# Patient Record
Sex: Male | Born: 1975 | Race: White | Hispanic: No | Marital: Married | Smoking: Never smoker
Health system: Southern US, Community
[De-identification: ages and names within clinical notes are randomized; demographics above are authoritative.]

## PROBLEM LIST (undated history)

## (undated) DIAGNOSIS — N200 Calculus of kidney: Secondary | ICD-10-CM

## (undated) DIAGNOSIS — I10 Essential (primary) hypertension: Secondary | ICD-10-CM

## (undated) HISTORY — PX: LITHOTRIPSY: SUR834

---

## 2012-06-23 ENCOUNTER — Emergency Department: Payer: Self-pay | Admitting: Emergency Medicine

## 2012-06-23 LAB — URINALYSIS, COMPLETE
Bilirubin,UR: NEGATIVE
Glucose,UR: NEGATIVE mg/dL (ref 0–75)
Nitrite: NEGATIVE
Ph: 5 (ref 4.5–8.0)
RBC,UR: 182 /HPF (ref 0–5)
Specific Gravity: 1.017 (ref 1.003–1.030)
Squamous Epithelial: NONE SEEN
WBC UR: 1 /HPF (ref 0–5)

## 2012-06-23 LAB — BASIC METABOLIC PANEL
Anion Gap: 4 — ABNORMAL LOW (ref 7–16)
Calcium, Total: 9.2 mg/dL (ref 8.5–10.1)
Chloride: 108 mmol/L — ABNORMAL HIGH (ref 98–107)
Co2: 28 mmol/L (ref 21–32)
EGFR (African American): >60
EGFR (Non-African Amer.): >60
Glucose: 101 mg/dL — ABNORMAL HIGH (ref 65–99)
Osmolality: 279 (ref 275–301)
Sodium: 140 mmol/L (ref 136–145)

## 2012-06-23 LAB — CBC
HCT: 44.7 % (ref 40.0–52.0)
MCH: 30.2 pg (ref 26.0–34.0)
MCHC: 34.4 g/dL (ref 32.0–36.0)
MCV: 88 fL (ref 80–100)
RDW: 12.8 % (ref 11.5–14.5)
WBC: 7.7 10*3/uL (ref 3.8–10.6)

## 2012-06-24 ENCOUNTER — Emergency Department: Payer: Self-pay | Admitting: Emergency Medicine

## 2012-06-27 ENCOUNTER — Ambulatory Visit: Payer: Self-pay | Admitting: Urology

## 2012-06-30 ENCOUNTER — Ambulatory Visit: Payer: Self-pay | Admitting: Urology

## 2012-07-25 ENCOUNTER — Ambulatory Visit: Payer: Self-pay | Admitting: Urology

## 2013-05-04 ENCOUNTER — Emergency Department: Payer: Self-pay | Admitting: Emergency Medicine

## 2013-05-04 LAB — COMPREHENSIVE METABOLIC PANEL
ALK PHOS: 93 U/L
Albumin: 4.2 g/dL (ref 3.4–5.0)
Anion Gap: 7 (ref 7–16)
BUN: 13 mg/dL (ref 7–18)
Bilirubin,Total: 0.6 mg/dL (ref 0.2–1.0)
CALCIUM: 9.2 mg/dL (ref 8.5–10.1)
CREATININE: 1.28 mg/dL (ref 0.60–1.30)
Chloride: 107 mmol/L (ref 98–107)
Co2: 23 mmol/L (ref 21–32)
EGFR (Non-African Amer.): >60
Glucose: 114 mg/dL — ABNORMAL HIGH (ref 65–99)
Osmolality: 275 (ref 275–301)
POTASSIUM: 3.6 mmol/L (ref 3.5–5.1)
SGOT(AST): 26 U/L (ref 15–37)
SGPT (ALT): 76 U/L (ref 12–78)
SODIUM: 137 mmol/L (ref 136–145)
Total Protein: 8.1 g/dL (ref 6.4–8.2)

## 2013-05-04 LAB — CBC WITH DIFFERENTIAL/PLATELET
BASOS ABS: 0.1 10*3/uL (ref 0.0–0.1)
Basophil %: 0.4 %
EOS ABS: 0.1 10*3/uL (ref 0.0–0.7)
Eosinophil %: 0.8 %
HCT: 49.8 % (ref 40.0–52.0)
HGB: 16.5 g/dL (ref 13.0–18.0)
LYMPHS ABS: 1.2 10*3/uL (ref 1.0–3.6)
LYMPHS PCT: 6.6 %
MCH: 29.7 pg (ref 26.0–34.0)
MCHC: 33.1 g/dL (ref 32.0–36.0)
MCV: 90 fL (ref 80–100)
MONOS PCT: 3.3 %
Monocyte #: 0.6 x10 3/mm (ref 0.2–1.0)
NEUTROS ABS: 15.9 10*3/uL — AB (ref 1.4–6.5)
NEUTROS PCT: 88.9 %
PLATELETS: 370 10*3/uL (ref 150–440)
RBC: 5.56 10*6/uL (ref 4.40–5.90)
RDW: 12.6 % (ref 11.5–14.5)
WBC: 17.9 10*3/uL — AB (ref 3.8–10.6)

## 2013-05-04 LAB — LIPASE, BLOOD: Lipase: 131 U/L (ref 73–393)

## 2013-05-04 LAB — TROPONIN I

## 2013-05-05 LAB — URINALYSIS, COMPLETE
BLOOD: NEGATIVE
Bilirubin,UR: NEGATIVE
Glucose,UR: NEGATIVE mg/dL (ref 0–75)
Hyaline Cast: 1
Ketone: NEGATIVE
LEUKOCYTE ESTERASE: NEGATIVE
NITRITE: NEGATIVE
PH: 5 (ref 4.5–8.0)
Protein: NEGATIVE
SPECIFIC GRAVITY: 1.031 (ref 1.003–1.030)

## 2013-05-09 LAB — CULTURE, BLOOD (SINGLE)

## 2014-03-30 ENCOUNTER — Ambulatory Visit: Payer: Self-pay | Admitting: Physician Assistant

## 2015-01-12 ENCOUNTER — Emergency Department: Payer: Managed Care, Other (non HMO)

## 2015-01-12 ENCOUNTER — Emergency Department
Admission: EM | Admit: 2015-01-12 | Discharge: 2015-01-12 | Disposition: A | Discharge status: Home / Self Care | Payer: Managed Care, Other (non HMO) | Attending: Emergency Medicine | Admitting: Emergency Medicine

## 2015-01-12 ENCOUNTER — Encounter: Payer: Self-pay | Admitting: Emergency Medicine

## 2015-01-12 DIAGNOSIS — I10 Essential (primary) hypertension: Secondary | ICD-10-CM | POA: Insufficient documentation

## 2015-01-12 DIAGNOSIS — R109 Unspecified abdominal pain: Secondary | ICD-10-CM | POA: Diagnosis present

## 2015-01-12 DIAGNOSIS — N200 Calculus of kidney: Secondary | ICD-10-CM

## 2015-01-12 DIAGNOSIS — R10A0 Flank pain, unspecified side: Secondary | ICD-10-CM

## 2015-01-12 HISTORY — DX: Essential (primary) hypertension: I10

## 2015-01-12 HISTORY — DX: Calculus of kidney: N20.0

## 2015-01-12 LAB — COMPREHENSIVE METABOLIC PANEL
ALT: 48 U/L (ref 17–63)
AST: 28 U/L (ref 15–41)
Albumin: 4.7 g/dL (ref 3.5–5.0)
Alkaline Phosphatase: 65 U/L (ref 38–126)
Anion gap: 8 (ref 5–15)
BILIRUBIN TOTAL: 0.5 mg/dL (ref 0.3–1.2)
BUN: 13 mg/dL (ref 6–20)
CALCIUM: 9.3 mg/dL (ref 8.9–10.3)
CO2: 23 mmol/L (ref 22–32)
Chloride: 109 mmol/L (ref 101–111)
Creatinine, Ser: 1.18 mg/dL (ref 0.61–1.24)
GFR calc Af Amer: >60 mL/min (ref >60)
Glucose, Bld: 140 mg/dL — ABNORMAL HIGH (ref 65–99)
POTASSIUM: 3.8 mmol/L (ref 3.5–5.1)
Sodium: 140 mmol/L (ref 135–145)
TOTAL PROTEIN: 7.5 g/dL (ref 6.5–8.1)

## 2015-01-12 LAB — URINALYSIS COMPLETE WITH MICROSCOPIC (ARMC ONLY)
BILIRUBIN URINE: NEGATIVE
GLUCOSE, UA: NEGATIVE mg/dL
KETONES UR: NEGATIVE mg/dL
LEUKOCYTES UA: NEGATIVE
NITRITE: NEGATIVE
Protein, ur: 30 mg/dL — AB
SPECIFIC GRAVITY, URINE: 1.018 (ref 1.005–1.030)
Squamous Epithelial / LPF: NONE SEEN
pH: 6 (ref 5.0–8.0)

## 2015-01-12 LAB — CBC
HEMATOCRIT: 47.4 % (ref 40.0–52.0)
Hemoglobin: 16 g/dL (ref 13.0–18.0)
MCH: 29.6 pg (ref 26.0–34.0)
MCHC: 33.8 g/dL (ref 32.0–36.0)
MCV: 87.4 fL (ref 80.0–100.0)
PLATELETS: 355 10*3/uL (ref 150–440)
RBC: 5.42 MIL/uL (ref 4.40–5.90)
RDW: 12.9 % (ref 11.5–14.5)
WBC: 13.7 10*3/uL — AB (ref 3.8–10.6)

## 2015-01-12 MED ORDER — KETOROLAC TROMETHAMINE 30 MG/ML IJ SOLN
30.0000 mg | Freq: Once | INTRAMUSCULAR | Status: AC
  Administered 2015-01-12: 30 mg via INTRAVENOUS
  Filled 2015-01-12: qty 1

## 2015-01-12 MED ORDER — SODIUM CHLORIDE 0.9 % IV BOLUS (SEPSIS)
1000.0000 mL | Freq: Once | INTRAVENOUS | Status: AC
  Administered 2015-01-12: 1000 mL via INTRAVENOUS

## 2015-01-12 MED ORDER — ONDANSETRON HCL 4 MG/2ML IJ SOLN
4.0000 mg | Freq: Once | INTRAMUSCULAR | Status: AC
Start: 2015-01-12 — End: 2015-01-12
  Administered 2015-01-12: 4 mg via INTRAVENOUS
  Filled 2015-01-12: qty 2

## 2015-01-12 MED ORDER — TAMSULOSIN HCL 0.4 MG PO CAPS: 0.4000 mg | ORAL_CAPSULE | Freq: Every day | ORAL | Status: AC

## 2015-01-12 MED ORDER — FENTANYL CITRATE (PF) 100 MCG/2ML IJ SOLN
50.0000 ug | Freq: Once | INTRAMUSCULAR | Status: AC
Start: 2015-01-12 — End: 2015-01-12
  Administered 2015-01-12: 50 ug via INTRAVENOUS

## 2015-01-12 MED ORDER — ONDANSETRON HCL 4 MG/2ML IJ SOLN
4.0000 mg | Freq: Once | INTRAMUSCULAR | Status: AC | PRN
  Administered 2015-01-12: 4 mg via INTRAVENOUS

## 2015-01-12 MED ORDER — HYDROMORPHONE HCL 1 MG/ML IJ SOLN
1.0000 mg | Freq: Once | INTRAMUSCULAR | Status: AC
Start: 2015-01-12 — End: 2015-01-12
  Administered 2015-01-12: 1 mg via INTRAVENOUS
  Filled 2015-01-12: qty 1

## 2015-01-12 MED ORDER — TAMSULOSIN HCL 0.4 MG PO CAPS
0.4000 mg | ORAL_CAPSULE | Freq: Once | ORAL | Status: AC
  Administered 2015-01-12: 0.4 mg via ORAL
  Filled 2015-01-12: qty 1

## 2015-01-12 MED ORDER — ONDANSETRON 4 MG PO TBDP
4.0000 mg | ORAL_TABLET | Freq: Three times a day (TID) | ORAL | Status: AC | PRN
Start: 2015-01-12 — End: ?

## 2015-01-12 MED ORDER — HYDROMORPHONE HCL 2 MG PO TABS: 2.0000 mg | ORAL_TABLET | Freq: Four times a day (QID) | ORAL | Status: AC | PRN

## 2015-01-12 MED ORDER — ONDANSETRON HCL 4 MG/2ML IJ SOLN
INTRAMUSCULAR | Status: AC
  Filled 2015-01-12: qty 2

## 2015-01-12 MED ORDER — FENTANYL CITRATE (PF) 100 MCG/2ML IJ SOLN
INTRAMUSCULAR | Status: AC
  Filled 2015-01-12: qty 2

## 2015-01-12 NOTE — ED Provider Notes (Signed)
[facility] Emergency Department Provider Note  ____________________________________________  Time seen: Approximately 3:31 AM  I have reviewed the triage vital signs and the nursing notes.   HISTORY  Chief Complaint Flank Pain    HPI Warren Weiss is a 39 y.o. male who presents to the ED from home with a chief complaint of right flank pain.Patient experienced sudden onset of right flank pain while eating dinner last evening. Describes sharp pain in his right flank that has subsequently radiated into his right side. Symptoms associated with nausea and vomiting. Denies associated fever, chills, chest pain, shortness of breath, hematuria. Denies associated testicular pain or swelling. Patient has a history of kidney stones and states this feels similar. Nothing makes this pain better or worse. Denies recent travel or trauma.   Past Medical History  Diagnosis Date  . Kidney stone   . Hypertension     There are no active problems to display for this patient.   Past Surgical History  Procedure Laterality Date  . Lithotripsy      No current outpatient prescriptions on file.  Allergies Review of patient's allergies indicates no known allergies.  History reviewed. No pertinent family history.  Social History Social History  Substance Use Topics  . Smoking status: Never Smoker   . Smokeless tobacco: Never Used  . Alcohol Use: No    Review of Systems Constitutional: No fever/chills Eyes: No visual changes. ENT: No sore throat. Cardiovascular: Denies chest pain. Respiratory: Denies shortness of breath. Gastrointestinal: No abdominal pain.  Positive for nausea and vomiting.  No diarrhea.  No constipation. Positive for right flank pain. Genitourinary: Negative for dysuria. Musculoskeletal: Negative for back pain. Skin: Negative for rash. Neurological: Negative for headaches, focal weakness or numbness.  10-point ROS otherwise  negative.  ____________________________________________   PHYSICAL EXAM:  VITAL SIGNS: ED Triage Vitals  Enc Vitals Group     BP 01/12/15 0114 169/109 mmHg     Pulse Rate 01/12/15 0114 63     Resp 01/12/15 0114 18     Temp 01/12/15 0114 98.8 F (37.1 C)     Temp Source 01/12/15 0114 Oral     SpO2 01/12/15 0114 100 %     Weight 01/12/15 0114 225 lb (102.059 kg)     Height 01/12/15 0114 6' (1.829 m)     Head Cir --      Peak Flow --      Pain Score 01/12/15 0114 7     Pain Loc --      Pain Edu? --      Excl. in GC? --     Constitutional: Alert and oriented. Well appearing and in mild acute distress. Eyes: Conjunctivae are normal. PERRL. EOMI. Head: Atraumatic. Nose: No congestion/rhinnorhea. Mouth/Throat: Mucous membranes are moist.  Oropharynx non-erythematous. Neck: No stridor.  Cardiovascular: Normal rate, regular rhythm. Grossly normal heart sounds.  Good peripheral circulation. Respiratory: Normal respiratory effort.  No retractions. Lungs CTAB. Gastrointestinal: Soft and nontender. No distention. No abdominal bruits. Mild right CVA tenderness. Musculoskeletal: No lower extremity tenderness nor edema.  No joint effusions. Neurologic:  Normal speech and language. No gross focal neurologic deficits are appreciated. No gait instability. Skin:  Skin is warm, dry and intact. No rash noted. Psychiatric: Mood and affect are normal. Speech and behavior are normal.  ____________________________________________   LABS (all labs ordered are listed, but only abnormal results are displayed)  Labs Reviewed  COMPREHENSIVE METABOLIC PANEL - Abnormal; Notable for the following:  Glucose, Bld 140 (*)    All other components within normal limits  CBC - Abnormal; Notable for the following:    WBC 13.7 (*)    All other components within normal limits  URINALYSIS COMPLETEWITH MICROSCOPIC ([facility] ONLY) - Abnormal; Notable for the following:    Color, Urine YELLOW (*)    APPearance  CLEAR (*)    Hgb urine dipstick 3+ (*)    Protein, ur 30 (*)    Bacteria, UA RARE (*)    All other components within normal limits  CBG MONITORING, ED   ____________________________________________  EKG  ED ECG REPORT I, SUNG,JADE J, the attending physician, personally viewed and interpreted this ECG.   Date: 01/12/2015  EKG Time: 0140  Rate: 69  Rhythm: normal EKG, normal sinus rhythm  Axis: Normal  Intervals:none  ST&T Change: Nonspecific  ____________________________________________  RADIOLOGY  CT renal stone study interpreted per Dr. Cherly Hensenhang: 1. Minimal right-sided hydronephrosis, with an obstructing 6 x 4 mm stone in the proximal right ureter, 5 cm below the right renal pelvis. 2. Small nonobstructing right renal stones measure up to 3 mm in size. 3. Small left renal cyst noted. 4. Diffuse fatty infiltration within the liver. ____________________________________________   PROCEDURES  Procedure(s) performed: None  Critical Care performed: No  ____________________________________________   INITIAL IMPRESSION / ASSESSMENT AND PLAN / ED COURSE  Pertinent labs & imaging results that were available during my care of the patient were reviewed by me and considered in my medical decision making (see chart for details).  39 year old male with a prior history of nephrolithiasis who presents with sudden onset right flank pain. Symptoms suggestive of kidney stone. Will obtain screening lab work, initiate IV fluid resuscitation, IV analgesia and obtain CT renal study.  ----------------------------------------- 4:45 AM on 01/12/2015 -----------------------------------------  Pain improved after Toradol. Awaiting urine specimen. Patient resting in no acute distress.  ----------------------------------------- 6:38 AM on 01/12/2015 -----------------------------------------  Patient sleeping soundly in no acute distress. Updated patient and father of laboratory and  imaging results. Given patient's pain is well-controlled and renal function is within normal limits, will discharge patient on a course of analgesia and Flomax with close urology follow-up. Given the holiday weekend, strict return precautions given to the patient including fever, persistent vomiting or intractable pain. Both patient and father verbalize understanding and agree with plan of care.  ____________________________________________   FINAL CLINICAL IMPRESSION(S) / ED DIAGNOSES  Final diagnoses:  Flank pain  Kidney stone      Irean HongJade J Sung, MD 01/12/15 1145

## 2015-01-12 NOTE — ED Notes (Signed)
Pt fainted in triage room after medication administered, pt vomiting prior to syncopal episode. Pt to room 26.

## 2015-01-12 NOTE — ED Notes (Signed)
Pt states right flank pain that began suddenly approx 3 hours pta with nausea and emesis. Pt denies known hematuria. Skin pwd.

## 2015-01-12 NOTE — Discharge Instructions (Signed)
1. Take pain & nausea medicines as needed (Dilaudid/Zofran #30). Make sure to take a stool softener while taking narcotic pain medicines. 2. Take Flomax 0.4mg  daily x 14 days. 3. Drink plenty of bottled or filtered water daily. 4. Return to the ER for worsening symptoms, persistent vomiting, fever, difficulty breathing or other concerns.   Flank Pain Flank pain refers to pain that is located on the side of the body between the upper abdomen and the back. The pain may occur over a short period of time (acute) or may be long-term or reoccurring (chronic). It may be mild or severe. Flank pain can be caused by many things. CAUSES  Some of the more common causes of flank pain include:  Muscle strains.   Muscle spasms.   A disease of your spine (vertebral disk disease).   A lung infection (pneumonia).   Fluid around your lungs (pulmonary edema).   A kidney infection.   Kidney stones.   A very painful skin rash caused by the chickenpox virus (shingles).   Gallbladder disease.  HOME CARE INSTRUCTIONS  Home care will depend on the cause of your pain. In general,  Rest as directed by your caregiver.  Drink enough fluids to keep your urine clear or pale yellow.  Only take over-the-counter or prescription medicines as directed by your caregiver. Some medicines may help relieve the pain.  Tell your caregiver about any changes in your pain.  Follow up with your caregiver as directed. SEEK IMMEDIATE MEDICAL CARE IF:   Your pain is not controlled with medicine.   You have new or worsening symptoms.  Your pain increases.   You have abdominal pain.   You have shortness of breath.   You have persistent nausea or vomiting.   You have swelling in your abdomen.   You feel faint or pass out.   You have blood in your urine.  You have a fever or persistent symptoms for more than 2-3 days.  You have a fever and your symptoms suddenly get worse. MAKE SURE YOU:    Understand these instructions.  Will watch your condition.  Will get help right away if you are not doing well or get worse.   This information is not intended to replace advice given to you by your health care provider. Make sure you discuss any questions you have with your health care provider.   Document Released: 02/19/2005 Document Revised: 09/23/2011 Document Reviewed: 08/13/2011 Elsevier Interactive Patient Education 2016 [employer]  Kidney Stones Kidney stones (urolithiasis) are deposits that form inside your kidneys. The intense pain is caused by the stone moving through the urinary tract. When the stone moves, the ureter goes into spasm around the stone. The stone is usually passed in the urine.  CAUSES   A disorder that makes certain neck glands produce too much parathyroid hormone (primary hyperparathyroidism).  A buildup of uric acid crystals, similar to gout in your joints.  Narrowing (stricture) of the ureter.  A kidney obstruction present at birth (congenital obstruction).  Previous surgery on the kidney or ureters.  Numerous kidney infections. SYMPTOMS   Feeling sick to your stomach (nauseous).  Throwing up (vomiting).  Blood in the urine (hematuria).  Pain that usually spreads (radiates) to the groin.  Frequency or urgency of urination. DIAGNOSIS   Taking a history and physical exam.  Blood or urine tests.  CT scan.  Occasionally, an examination of the inside of the urinary bladder (cystoscopy) is performed. TREATMENT   Observation.  Increasing your fluid intake.  Extracorporeal shock wave lithotripsy--This is a noninvasive procedure that uses shock waves to break up kidney stones.  Surgery may be needed if you have severe pain or persistent obstruction. There are various surgical procedures. Most of the procedures are performed with the use of small instruments. Only small incisions are needed to accommodate these instruments, so  recovery time is minimized. The size, location, and chemical composition are all important variables that will determine the proper choice of action for you. Talk to your health care provider to better understand your situation so that you will minimize the risk of injury to yourself and your kidney.  HOME CARE INSTRUCTIONS   Drink enough water and fluids to keep your urine clear or pale yellow. This will help you to pass the stone or stone fragments.  Strain all urine through the provided strainer. Keep all particulate matter and stones for your health care provider to see. The stone causing the pain may be as small as a grain of salt. It is very important to use the strainer each and every time you pass your urine. The collection of your stone will allow your health care provider to analyze it and verify that a stone has actually passed. The stone analysis will often identify what you can do to reduce the incidence of recurrences.  Only take over-the-counter or prescription medicines for pain, discomfort, or fever as directed by your health care provider.  Keep all follow-up visits as told by your health care provider. This is important.  Get follow-up X-rays if required. The absence of pain does not always mean that the stone has passed. It may have only stopped moving. If the urine remains completely obstructed, it can cause loss of kidney function or even complete destruction of the kidney. It is your responsibility to make sure X-rays and follow-ups are completed. Ultrasounds of the kidney can show blockages and the status of the kidney. Ultrasounds are not associated with any radiation and can be performed easily in a matter of minutes.  Make changes to your daily diet as told by your health care provider. You may be told to:  Limit the amount of salt that you eat.  Eat 5 or more servings of fruits and vegetables each day.  Limit the amount of meat, poultry, fish, and eggs that you  eat.  Collect a 24-hour urine sample as told by your health care provider.You may need to collect another urine sample every 6-12 months. SEEK MEDICAL CARE IF:  You experience pain that is progressive and unresponsive to any pain medicine you have been prescribed. SEEK IMMEDIATE MEDICAL CARE IF:   Pain cannot be controlled with the prescribed medicine.  You have a fever or shaking chills.  The severity or intensity of pain increases over 18 hours and is not relieved by pain medicine.  You develop a new onset of abdominal pain.  You feel faint or pass out.  You are unable to urinate.   This information is not intended to replace advice given to you by your health care provider. Make sure you discuss any questions you have with your health care provider.   Document Released: 12/29/2004 Document Revised: 09/19/2014 Document Reviewed: 06/01/2012 Elsevier Interactive Patient Education [employer].

## 2017-04-05 IMAGING — CT CT RENAL STONE PROTOCOL
1 of 2 series · 15 of 32 positions shown, 19 images · non-contrast
Comparison: CT of the abdomen and pelvis from 05/04/2013

CLINICAL DATA: Acute onset of right flank pain, nausea and
vomiting. Initial encounter.

EXAM:
CT ABDOMEN AND PELVIS WITHOUT CONTRAST
TECHNIQUE: Multidetector CT imaging of the abdomen and pelvis was performed
following the standard protocol without IV contrast.

[Series 2: stone standard full · axial · 0.77mm/px · z∈[-518,-34]mm · 15 of 107 slices shown, 19 images]
[im 5/107  soft-tissue]
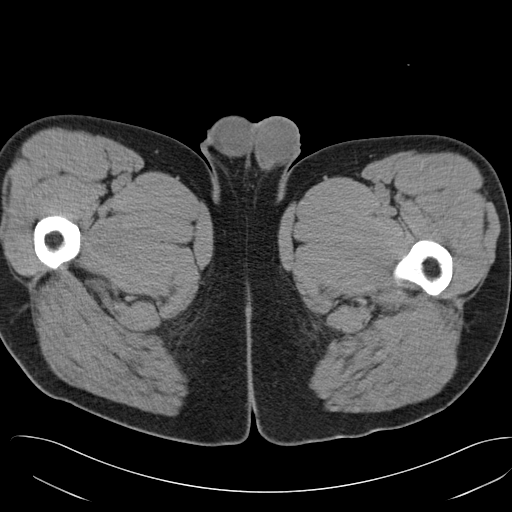
[im 5/107  bone]
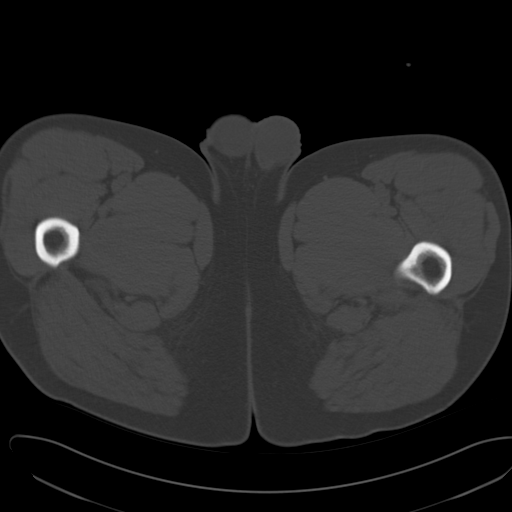
[im 13/107  soft-tissue]
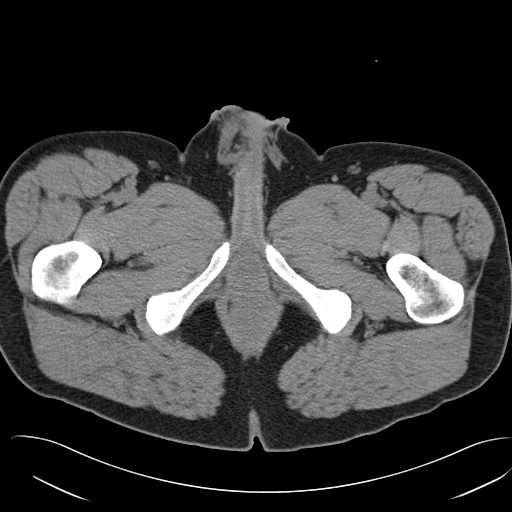
[im 22/107  soft-tissue]
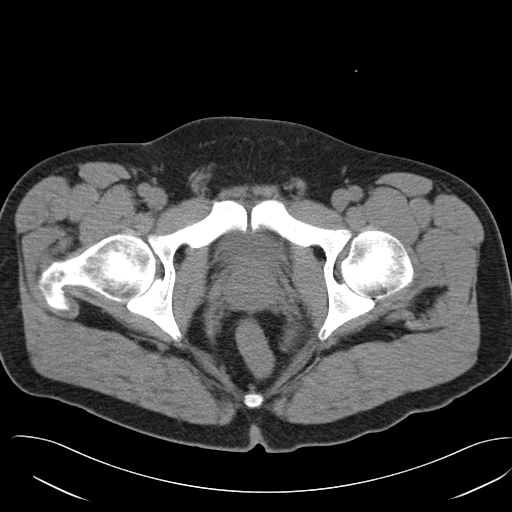
[im 30/107  soft-tissue]
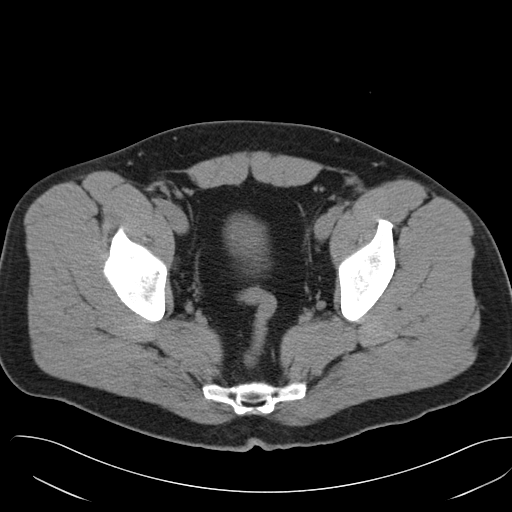
[im 39/107  soft-tissue]
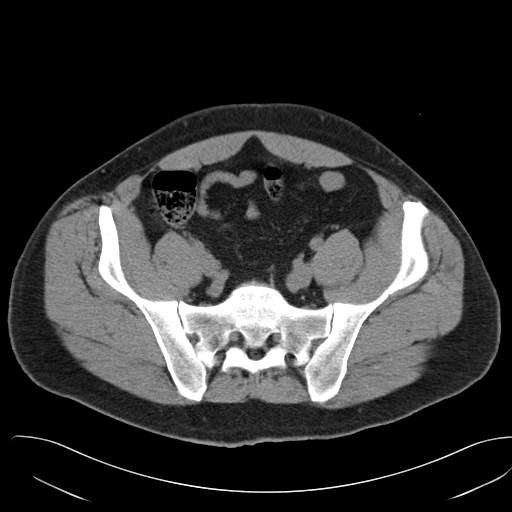
[im 47/107  soft-tissue]
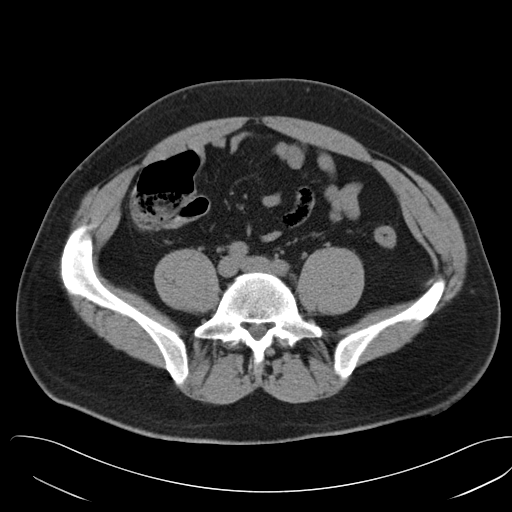
[im 56/107  soft-tissue]
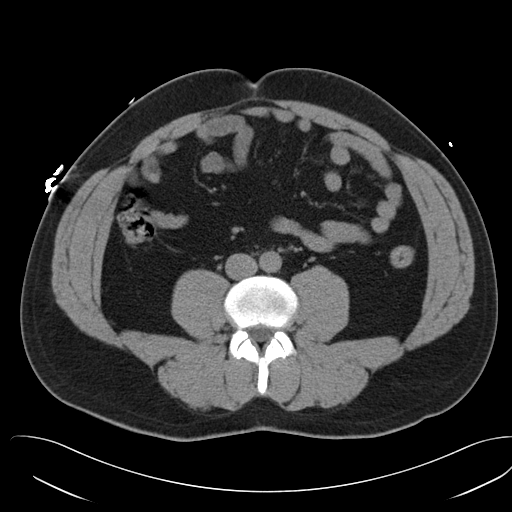
[im 60/107  soft-tissue]
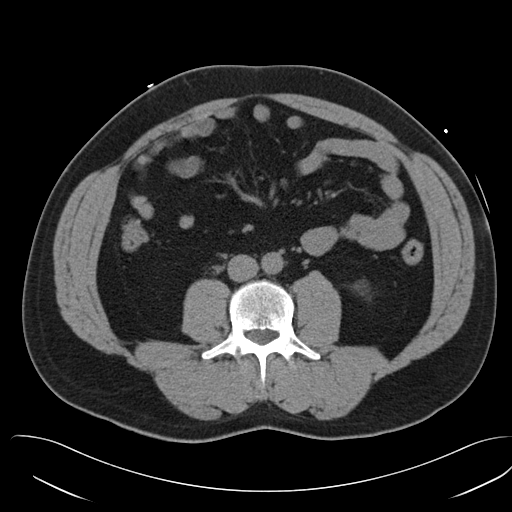
[im 68/107  soft-tissue]
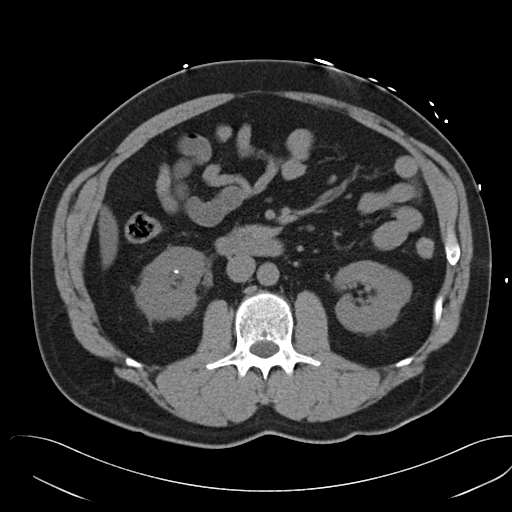
[im 68/107  bone]
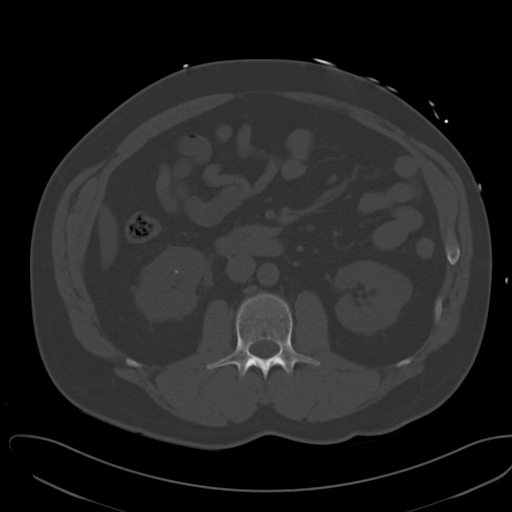
[im 77/107  soft-tissue]
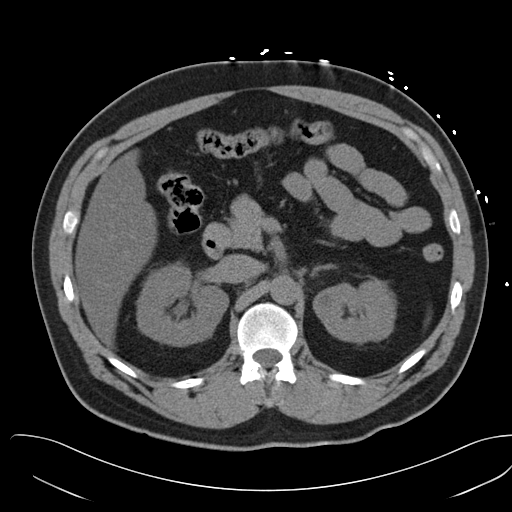
[im 85/107  soft-tissue]
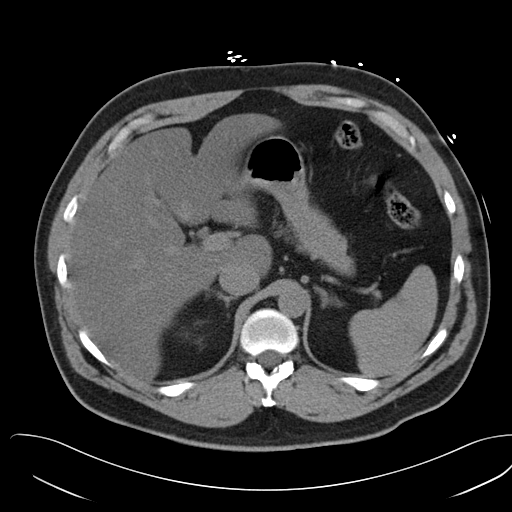
[im 90/107  lung]
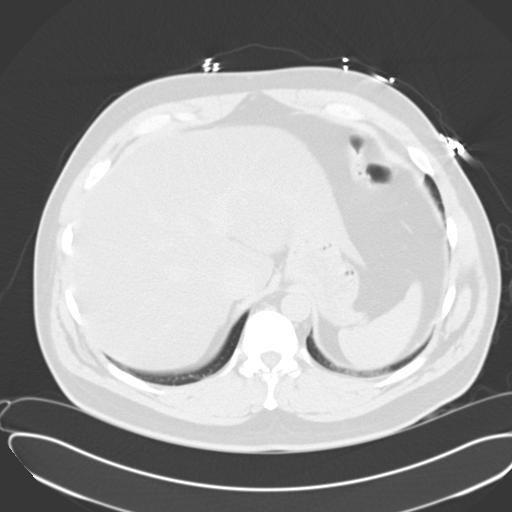
[im 94/107  soft-tissue]
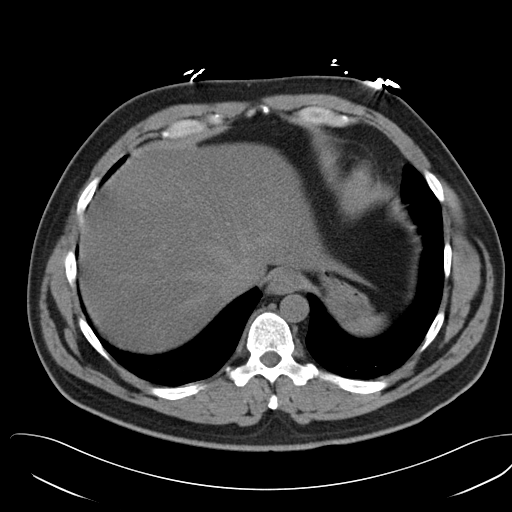
[im 94/107  lung]
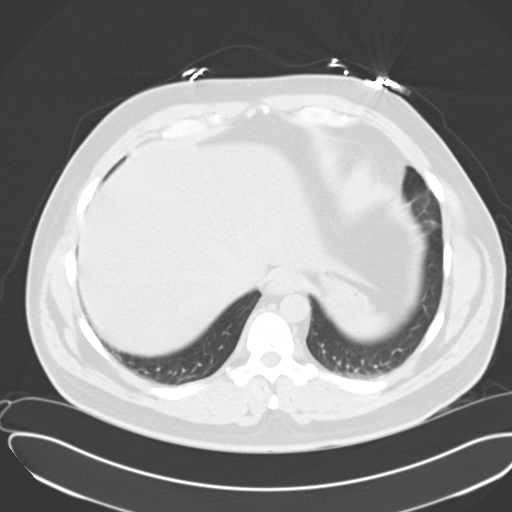
[im 98/107  lung]
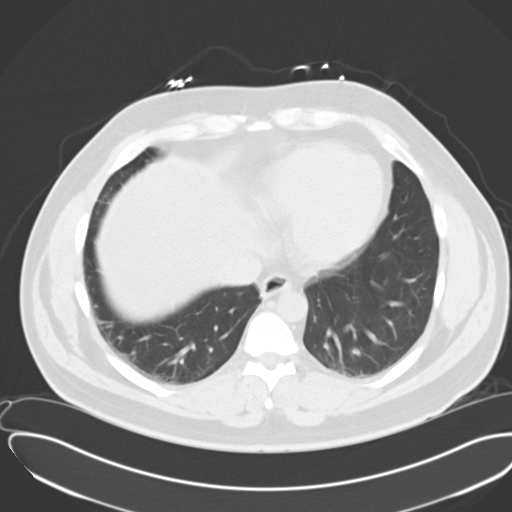
[im 102/107  soft-tissue]
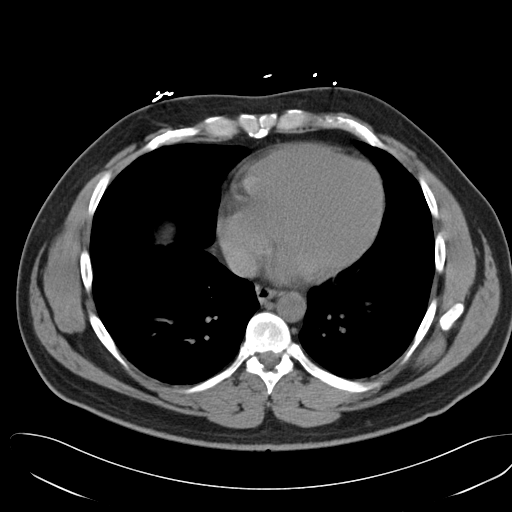
[im 102/107  lung]
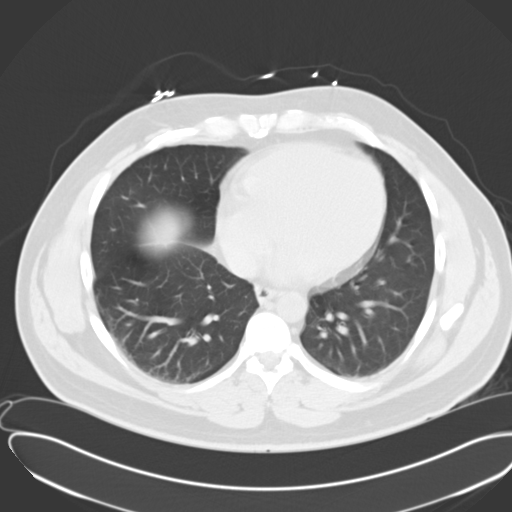

[15 of 32 positions shown; findings below may reference images not displayed]

FINDINGS: Minimal bibasilar atelectasis is noted.

Diffuse fatty infiltration is noted within the liver. The liver and
spleen are otherwise unremarkable. The gallbladder is within normal
limits. The pancreas and adrenal glands are unremarkable.

There is minimal right-sided hydronephrosis, with an obstructing 6 x
4 mm stone at the proximal right ureter, 5 cm below the right renal
pelvis. Small nonobstructing right renal stones measure up to 3 mm
in size. A 1.0 cm left renal cyst is noted. Nonspecific perinephric
stranding is noted bilaterally. The kidneys are otherwise
unremarkable.

No free fluid is identified. The small bowel is unremarkable in
appearance. The stomach is within normal limits. No acute vascular
abnormalities are seen.

The appendix is normal in caliber and contains air, without evidence
of appendicitis. The colon is unremarkable in appearance.

The bladder is mildly distended and grossly unremarkable. The
prostate remains normal in size, with minimal calcification. No
inguinal lymphadenopathy is seen.

No acute osseous abnormalities are identified.
IMPRESSION: 1. Minimal right-sided hydronephrosis, with an obstructing 6 x 4 mm
stone in the proximal right ureter, 5 cm below the right renal
pelvis.
2. Small nonobstructing right renal stones measure up to 3 mm in
size.
3. Small left renal cyst noted.
4. Diffuse fatty infiltration within the liver.
